# Patient Record
Sex: Female | Born: 1955 | Race: Black or African American | Hispanic: No | Marital: Married
Health system: Southern US, Community
[De-identification: ages and names within clinical notes are randomized; demographics above are authoritative.]

---

## 2008-08-24 ENCOUNTER — Encounter: Admission: RE | Admit: 2008-08-24 | Discharge: 2008-08-24 | Payer: Self-pay | Admitting: Family Medicine

## 2010-01-19 ENCOUNTER — Ambulatory Visit: Payer: Self-pay | Admitting: Vascular Surgery

## 2010-02-01 ENCOUNTER — Encounter: Admission: RE | Admit: 2010-02-01 | Discharge: 2010-02-01 | Payer: Self-pay | Admitting: Family Medicine

## 2011-01-17 ENCOUNTER — Ambulatory Visit
Admission: RE | Admit: 2011-01-17 | Discharge: 2011-01-17 | Disposition: A | Discharge status: Home / Self Care | Payer: BC Managed Care – HMO | Source: Ambulatory Visit | Attending: Family Medicine | Admitting: Family Medicine

## 2011-01-17 ENCOUNTER — Other Ambulatory Visit: Payer: Self-pay | Admitting: Family Medicine

## 2011-01-17 DIAGNOSIS — M25569 Pain in unspecified knee: Secondary | ICD-10-CM

## 2011-01-31 NOTE — Procedures (Signed)
LOWER EXTREMITY VENOUS REFLUX EXAM   INDICATION:  Painful left legs with spider veins.   EXAM:  Using color-flow imaging and pulse Doppler spectral analysis, the  left common femoral, superficial femoral, popliteal, posterior tibial,  greater and lesser saphenous veins are evaluated.  There is no evidence  suggesting deep venous insufficiency in the left lower extremity.   The left saphenofemoral junction is competent. The left GSV is  competent.   The left proximal short saphenous vein demonstrates competency.   GSV Diameter (used if found to be incompetent only)                                            Right    Left  Proximal Greater Saphenous Vein           cm       cm  Proximal-to-mid-thigh                     cm       cm  Mid thigh                                 cm       cm  Mid-distal thigh                          cm       cm  Distal thigh                              cm       cm  Knee                                      cm       cm   IMPRESSION:  1. No reflux noted in the left greater saphenous vein.  2. The left greater saphenous vein is not aneurysmal.  3. The left greater saphenous vein is not tortuous.  4. The deep venous system is competent.  5. The lesser saphenous vein is competent.   ___________________________________________  Larina Earthly, M.D.   CB/MEDQ  D:  01/19/2010  T:  01/19/2010  Job:  [id number]

## 2011-01-31 NOTE — Consult Note (Signed)
NEW PATIENT CONSULTATION   [name] [name]  DOB:  1955-11-21                                       01/19/2010  [id number]#:[id number]   The patient presents today for evaluation of bilateral lower extremity  venous pathology.  She is an active, healthy, 55 year old female who  presents for evaluation of painful telangiectasia over both lower  extremities.  She reports this is much more severe on the left leg than  on the right.  She does not have any history of DVT, no history of  __________  from these.  She does report that she has discomfort most  particularly over her left popliteal space and also over the left calf  and also medial thighs bilaterally with prolonged standing.  She has no  varicosities.   PAST MEDICAL HISTORY:  Negative for severe premature atherosclerotic  disease.  She denies diabetes or cardiac difficulty.   FAMILY HISTORY:  Negative for premature atherosclerotic disease.   SOCIAL HISTORY:  She is married with three children.  She works as a  IT sales professional.  She does not drink alcohol and does not smoke  cigarettes.   REVIEW OF SYSTEMS:  Positive for weight gain.  Otherwise completely  negative as documented in her chart.   PHYSICAL EXAMINATION:  Well developed, black female appearing stated  age.  Blood pressure 115/74, pulse 70, respirations 16.  She is in no  acute distress.  HEENT:  Negative.  Musculoskeletal:  She has no major  deformity or cyanosis.  Neurologic:  No focal weakness or paresthesias.  Skin:  Without ulcers or rashes.  She does have palpable pedal pulses  and palpable radial pulses bilaterally.   She underwent noninvasive vascular laboratory studies in our office.  This revealed no evidence of left leg DVT.  No evidence of deep venous  reflux.  Her small and great saphenous veins showed no reflux in her  left leg as well.  She does have significant telangiectasias diffusely  over both lower extremities.  These are  more prominent in her left  popliteal space and large plexus of these in her left lateral calf.   I discussed her noninvasive studies with the patient and explained that  I do not see any evidence of serious venous pathology.  I did explain  treatment option for the telangiectasia which would be sclerotherapy.  I  have explained that despite the discomfort related to these that  insurance carries do not pay for sclerotherapy.  I did explain our out-  of-pocket potential cost for treatment of these areas of discomfort.  I  also explained that the diffuse nature of her spider vein  telangiectasias could all be treated but this would require multiple  repeated treatment for correction of all these.  She will consider her  options and notify us should she wish to proceed with sclerotherapy     Larina Earthly, M.D.  Electronically Signed   TFE/MEDQ  D:  01/19/2010  T:  01/20/2010  Job:  [id number]   cc:   Renaye Rakers, M.D.

## 2012-05-14 ENCOUNTER — Other Ambulatory Visit (HOSPITAL_COMMUNITY)
Admission: RE | Admit: 2012-05-14 | Discharge: 2012-05-14 | Disposition: A | Discharge status: Home / Self Care | Payer: BC Managed Care – HMO | Source: Ambulatory Visit | Attending: Family Medicine | Admitting: Family Medicine

## 2012-05-14 DIAGNOSIS — Z01419 Encounter for gynecological examination (general) (routine) without abnormal findings: Secondary | ICD-10-CM | POA: Insufficient documentation

## 2012-05-14 DIAGNOSIS — Z1151 Encounter for screening for human papillomavirus (HPV): Secondary | ICD-10-CM | POA: Insufficient documentation

## 2013-11-19 ENCOUNTER — Other Ambulatory Visit: Payer: Self-pay | Admitting: Family Medicine

## 2013-11-19 DIAGNOSIS — M25561 Pain in right knee: Secondary | ICD-10-CM

## 2013-11-26 ENCOUNTER — Ambulatory Visit
Admission: RE | Admit: 2013-11-26 | Discharge: 2013-11-26 | Disposition: A | Discharge status: Home / Self Care | Payer: BC Managed Care – HMO | Source: Ambulatory Visit | Attending: Family Medicine | Admitting: Family Medicine

## 2013-11-26 DIAGNOSIS — M25561 Pain in right knee: Secondary | ICD-10-CM

## 2015-07-04 IMAGING — US US EXTREM LOW*R* LIMITED
1 series · 14 of 25 positions shown · non-contrast
Comparison: none

[Series 1: us extrem low*right* limited · 0.17mm/px · 14 of 43 slices shown]
[im 1/43]
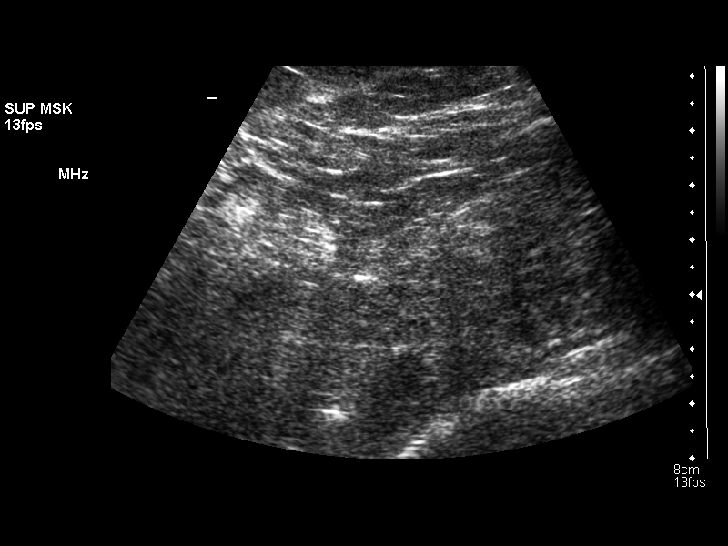
[im 4/43]
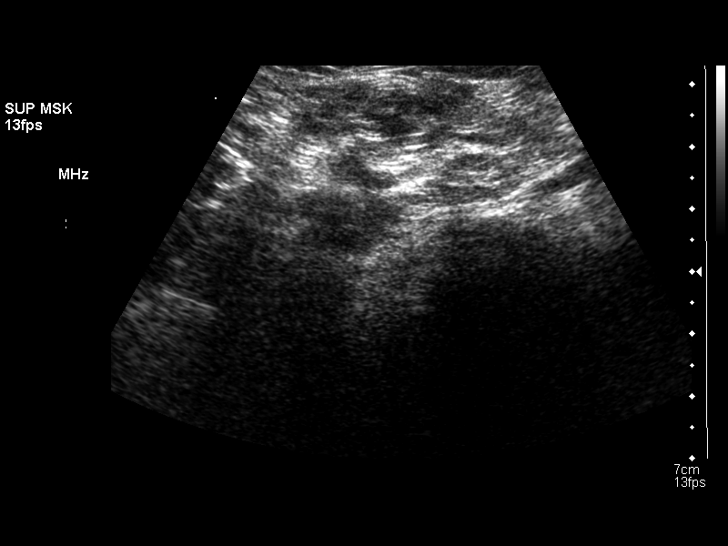
[im 8/43]
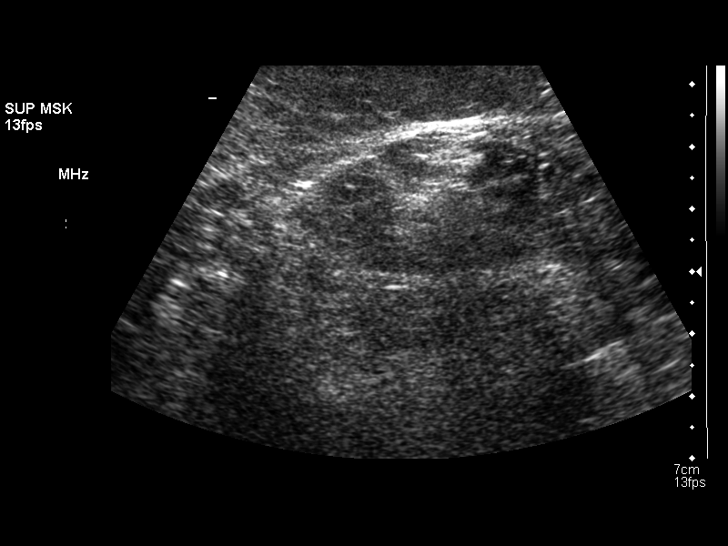
[im 11/43]
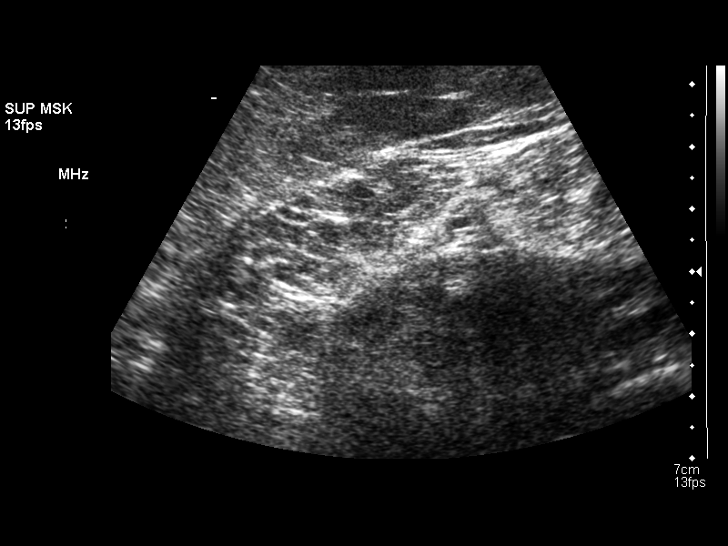
[im 15/43]
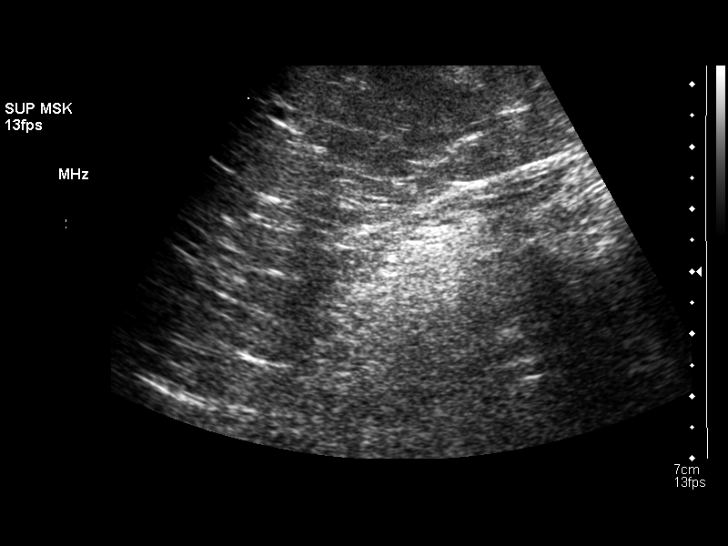
[im 16/43]
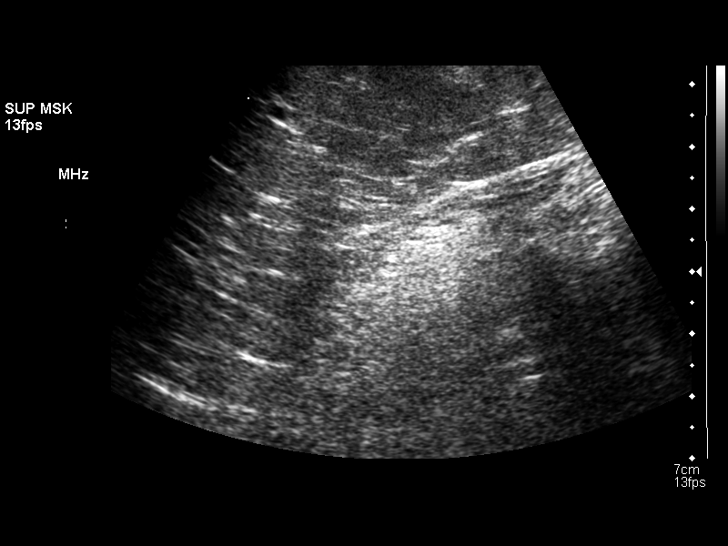
[im 20/43]
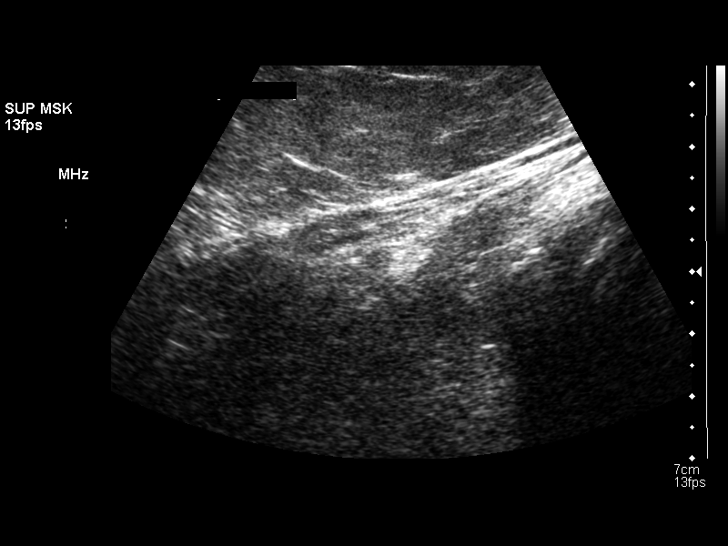
[im 23/43]
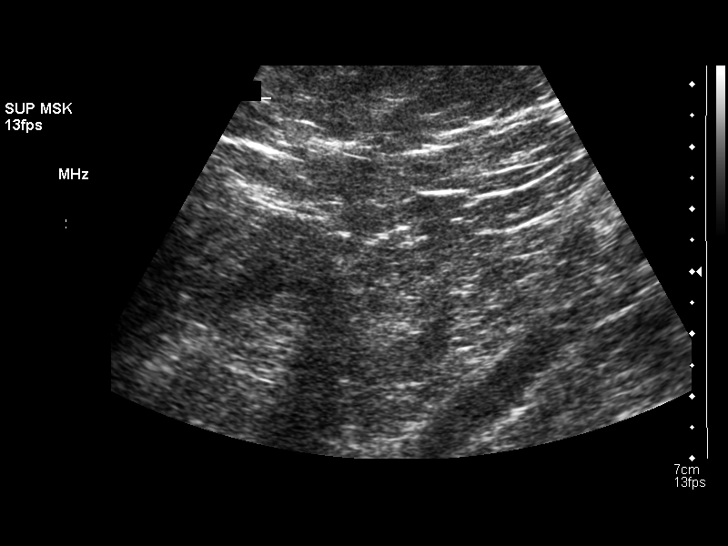
[im 27/43]
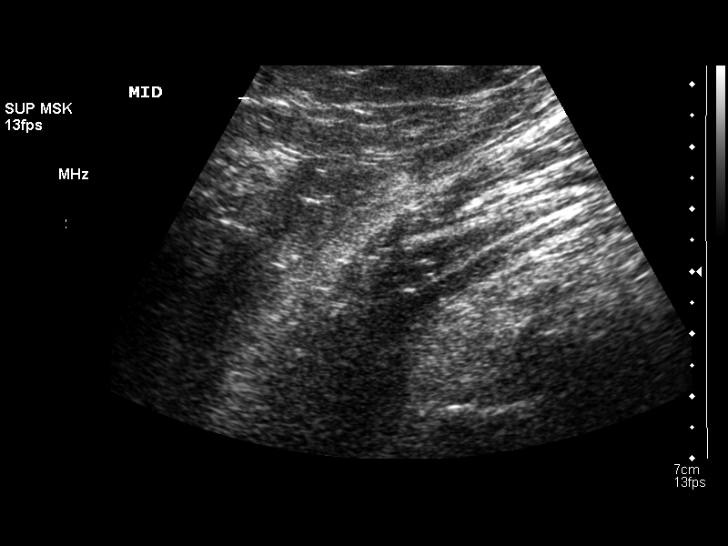
[im 29/43]
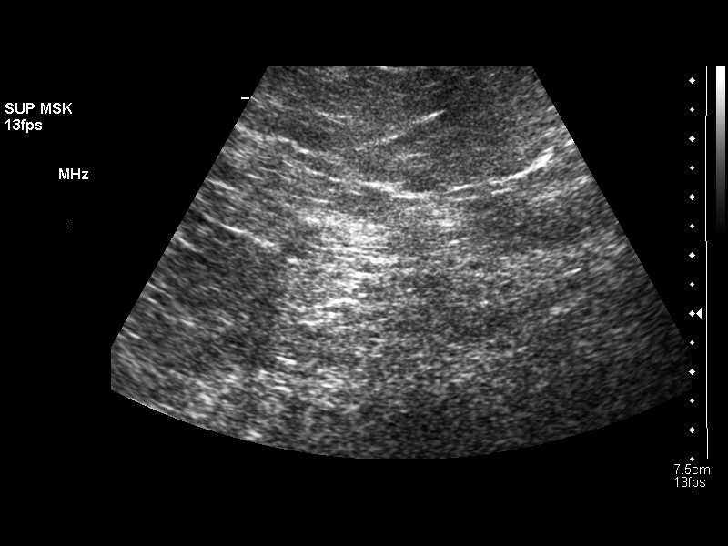
[im 32/43]
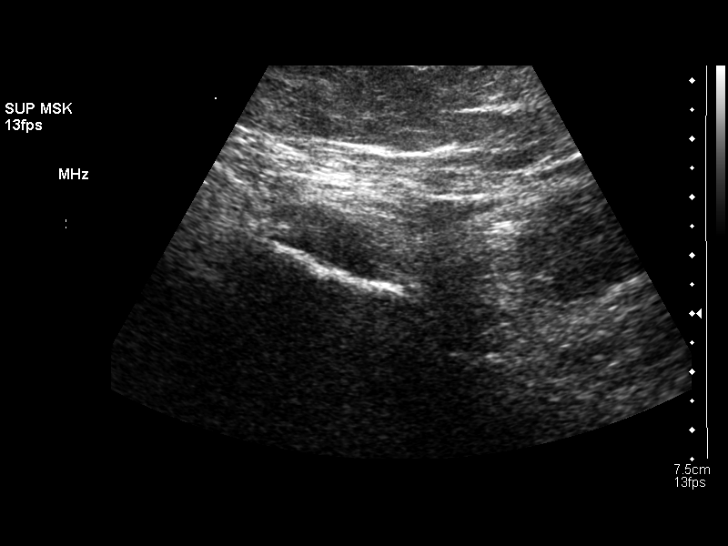
[im 36/43]
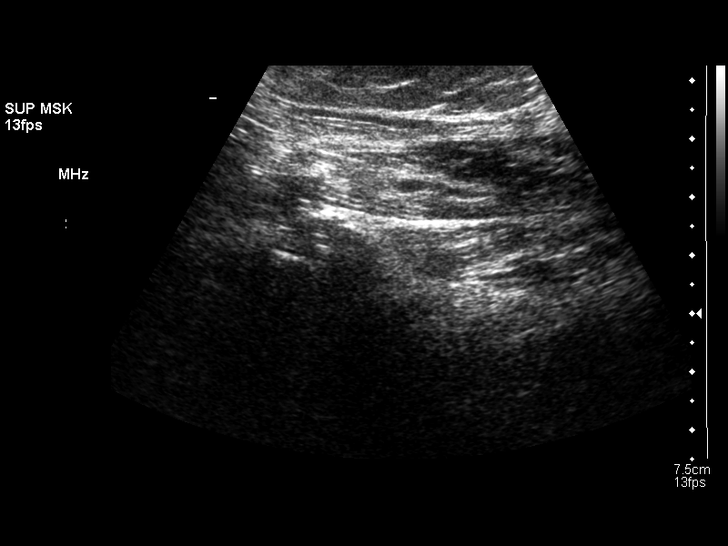
[im 39/43]
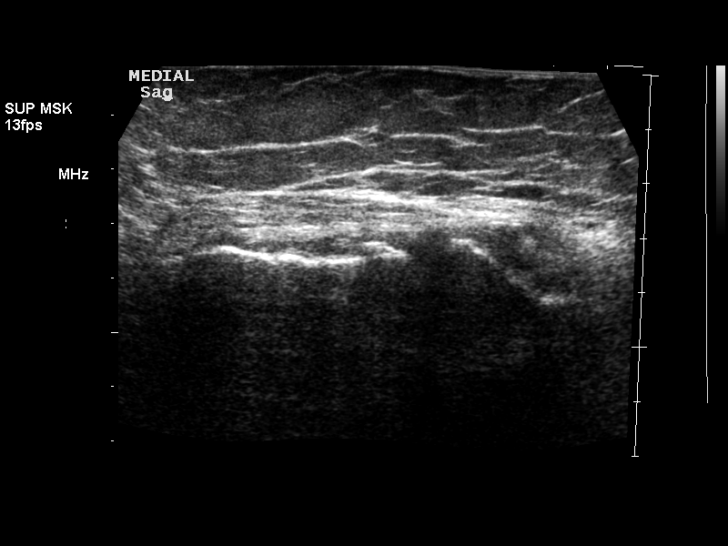
[im 43/43]
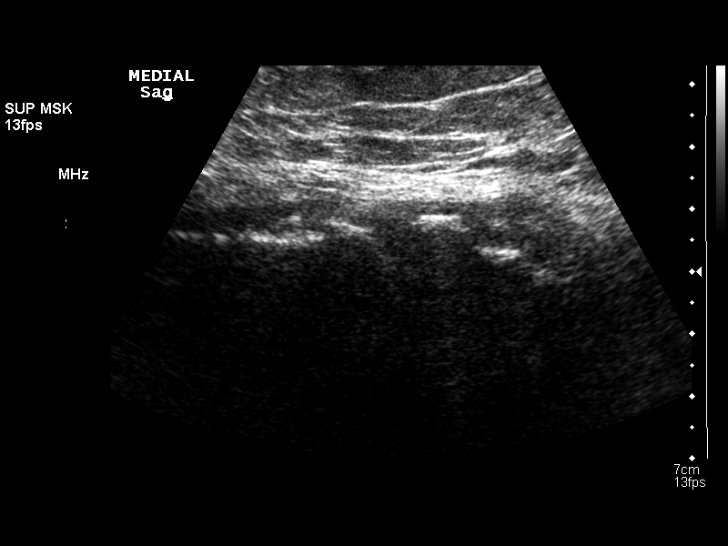

[14 of 25 positions shown; findings below may reference images not displayed]

CLINICAL DATA
Right leg pain.  Baker cyst.

EXAM
ULTRASOUND Right LOWER EXTREMITY LIMITED

TECHNIQUE
Ultrasound examination of the lower extremity soft tissues was
performed in the area of clinical concern.

COMPARISON
DG KNEE 2 VIEWS*R* dated 02/01/2010

FINDINGS
No Baker's cyst is identified. There is a thin area of fluid in the
proximal leg medially just inferior to the popliteal fossa.
Potentially this represents ruptured Baker's cyst with fluid
tracking along the gastrocnemius.

IMPRESSION
No Baker's cyst identified. Small amount of fluid in the proximal
leg may represent ruptured Baker's cyst.

SIGNATURE

## 2015-10-06 ENCOUNTER — Ambulatory Visit
Admission: RE | Admit: 2015-10-06 | Discharge: 2015-10-06 | Disposition: A | Discharge status: Home / Self Care | Payer: BLUE CROSS/BLUE SHIELD | Source: Ambulatory Visit | Attending: Family Medicine | Admitting: Family Medicine

## 2015-10-06 ENCOUNTER — Other Ambulatory Visit: Payer: Self-pay | Admitting: Family Medicine

## 2015-10-06 DIAGNOSIS — M79671 Pain in right foot: Secondary | ICD-10-CM

## 2017-05-13 IMAGING — CR DG FOOT COMPLETE 3+V*R*
3 series · 3 of 3 positions shown · non-contrast
Comparison: Right ankle series 35255.

CLINICAL DATA: 60-year-old female with right foot pain for 5
months, increasing. Lateral pain radiating to the Achilles area. No
known injury. Initial encounter.

EXAM:
RIGHT FOOT COMPLETE - 3+ VIEW

[x foot ap right]
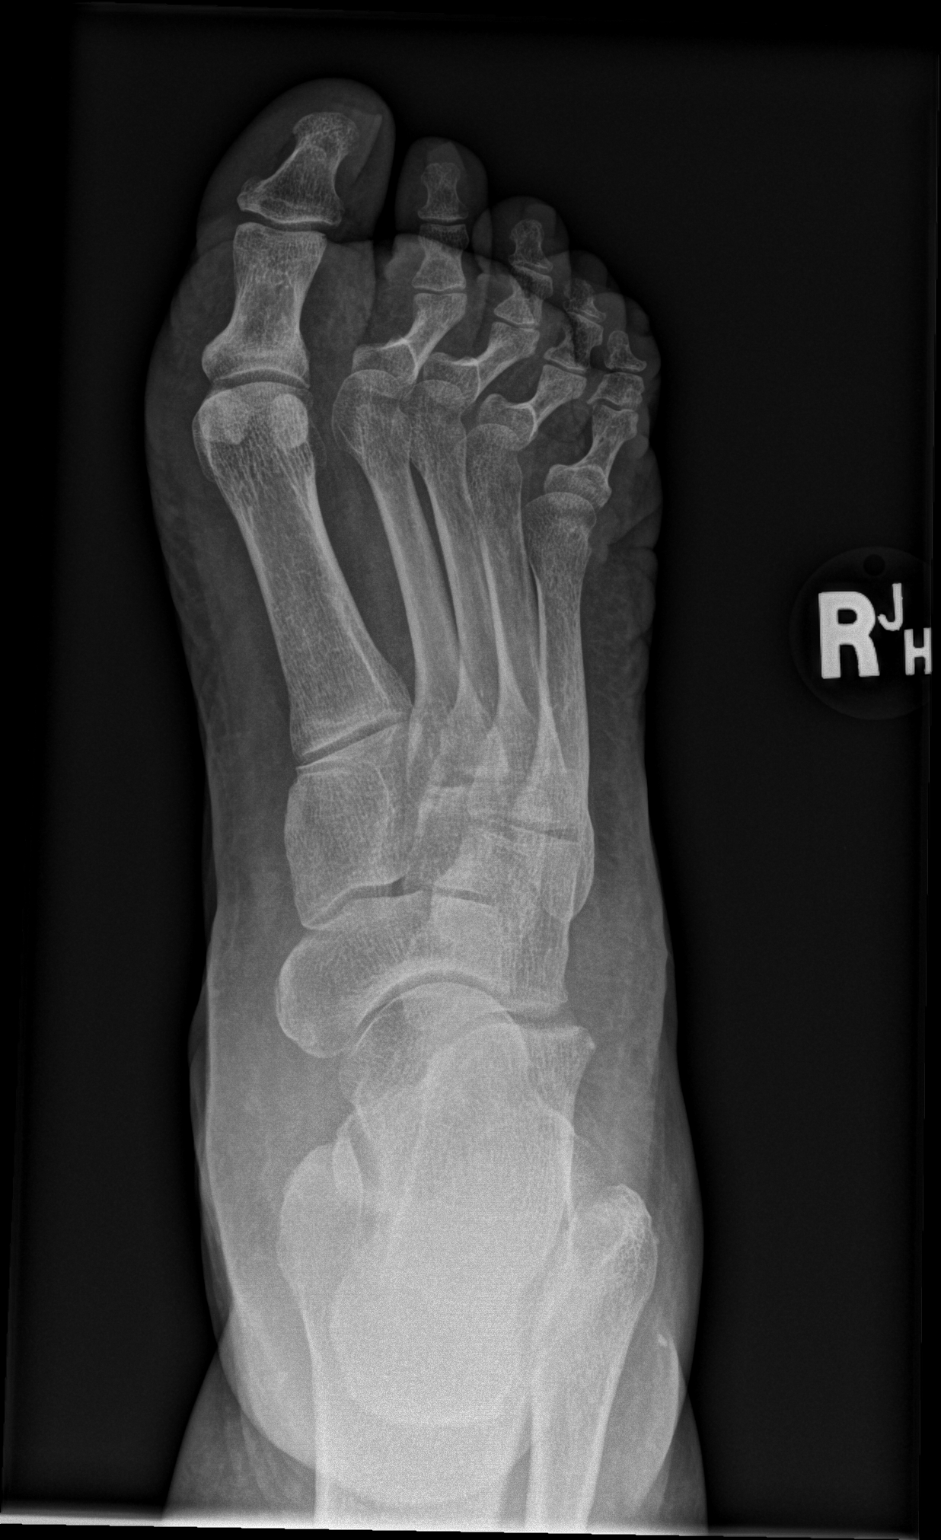

[x foot obl right]
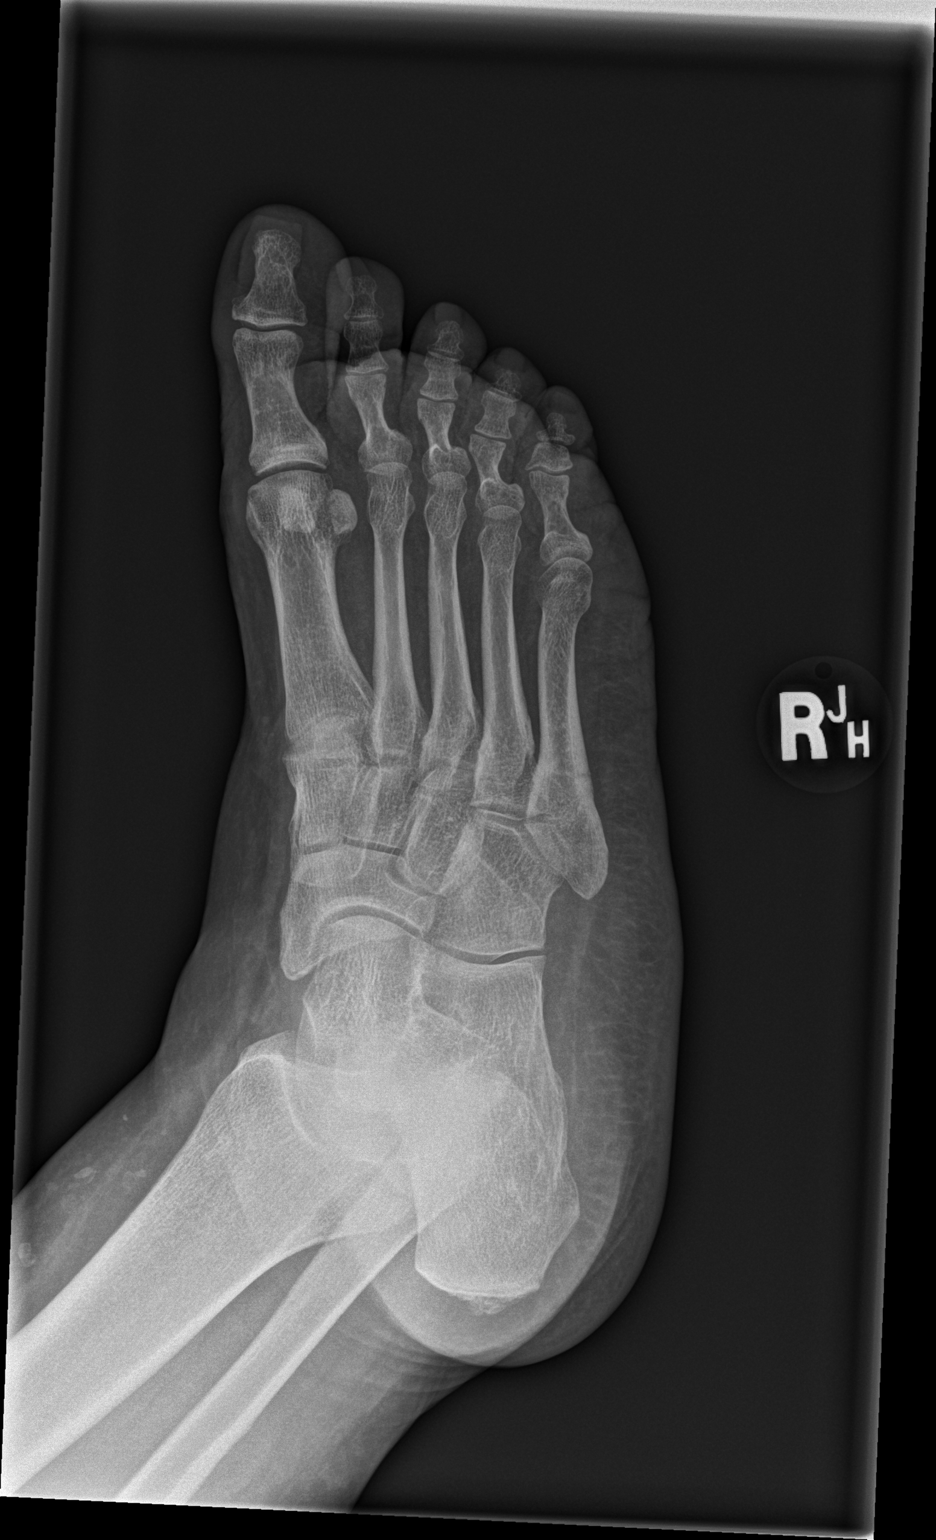

[x foot lat right]
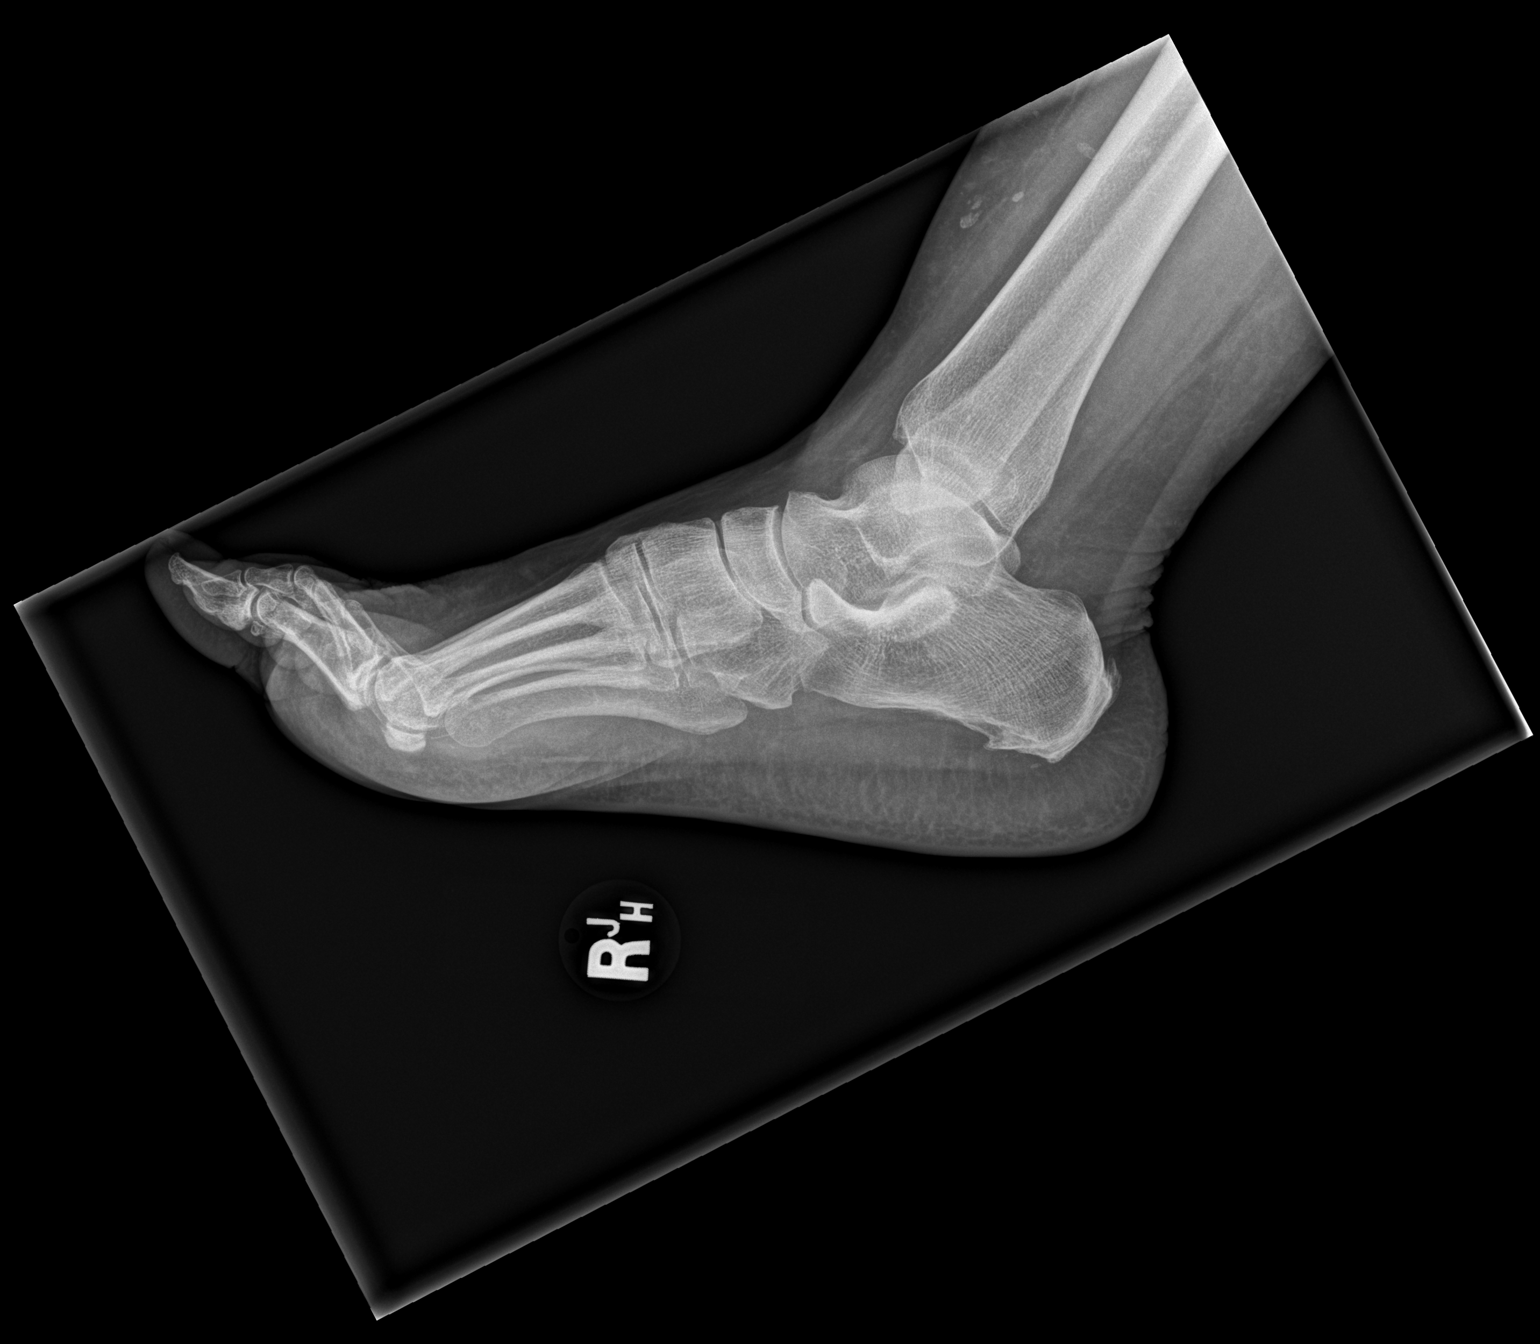

[3 of 3 positions shown; findings below may reference images not displayed]

FINDINGS: Lucency through the lateral cortex of the proximal fifth metatarsal
(arrow), new since 8977. Pes planus. Calcaneus appears intact with
mildly increased degenerative spurring. Other metatarsals appear
intact. Phalanges appear intact. Joint spaces within normal limits.
Tarsal bones appear intact.
IMPRESSION: Stress fracture (nondisplaced) at the proximal meta diaphysis of the
fifth metatarsal. No other acute osseous abnormality identified.

## 2018-10-29 ENCOUNTER — Encounter: Payer: Self-pay | Admitting: Nurse Practitioner

## 2023-03-21 ENCOUNTER — Ambulatory Visit
Admission: RE | Admit: 2023-03-21 | Discharge: 2023-03-21 | Disposition: A | Discharge status: Home / Self Care | Payer: BLUE CROSS/BLUE SHIELD | Source: Ambulatory Visit | Attending: Family Medicine | Admitting: Family Medicine

## 2023-03-21 ENCOUNTER — Other Ambulatory Visit: Payer: Self-pay | Admitting: Family Medicine

## 2023-03-21 DIAGNOSIS — M25532 Pain in left wrist: Secondary | ICD-10-CM

## 2024-09-23 NOTE — Progress Notes (Unsigned)
 "               [name] Brown [name] [address] [place] [address] Phone: [phone]   Assessment and Plan:     1. Chronic pain of left knee (Primary) 2. Primary osteoarthritis of left knee -Chronic with exacerbation, initial sports medicine visit - Consistent with flare of osteoarthritis, primarily medial and patellofemoral compartments. - Reviewed x-ray at today's visit.  My interpretation: No acute fracture or dislocation.  Primarily medial and patellofemoral compartment osteoarthritis - From reviewing orthopedic surgery notes, it appears patient had an MRI that showed insufficiency fracture, osteoarthritis, meniscal degeneration.  Will have patient sign records release form to obtain this imaging and results. - Patient says she had CSI at visit in 06/2024 with no improvement.  Could consider Zilretta versus HA injection in the future.  Will obtain prior authorization - Start meloxicam  15 mg daily x2 weeks.  If still having pain after 2 weeks, complete 3rd-week of NSAID. May use remaining NSAID as needed once daily for pain control.  Do not to use additional over-the-counter NSAIDs (ibuprofen, naproxen, Advil, Aleve, etc.) while taking prescription NSAIDs.  May use Tylenol [phone] mg 2 to 3 times a day for breakthrough pain.    15 additional minutes spent for educating Therapeutic Home Exercise Program.  This included exercises focusing on stretching, strengthening, with focus on eccentric aspects.   Long term goals include an improvement in range of motion, strength, endurance as well as avoiding reinjury. Patient's frequency would include in 1-2 times a day, 3-5 times a week for a duration of 6-12 weeks. Proper technique shown and discussed handout in great detail with ATC.  All questions were discussed and answered.    Pertinent previous records reviewed include  ortho note 06/25/24   Follow Up: 4 weeks for reevaluation.  could consider zilretta vs  HA vs PT if no improvement     Subjective:   I, [name] Brown, am serving as a neurosurgeon for [name] Brown  Chief Complaint: left knee pain   HPI:   09/24/2024 Patient is a 69 year old female with left knee pain. Patient states pain since September. No MOI. Decreased ROM . Has had a CSI in the past. Pain when walking. Ibu and tylenol do not help.   Was seen [facility] 06/25/2024 I discussed the situation with the patient. She does have a decompensated medial compartment. She does have the advanced degenerative change and now the root tear has likely escalated her symptoms. She does have the insufficiency fracture which is also contributing. Fortunately, she seems to be trending some better. At this point in time organ to continue conservative management with gentle range of motion and home exercises. I discussed a recumbent bike as potentially an option for treatment for her. I did refill her hydrocodone which she is trying to use very sparingly. I have discussed this with one of my partners Dr. Teresa who stated she may end up needing a total knee arthroplasty. She is not to the point she would consider that as of yet. Will continue to manage this conservatively. Follow-up in 1 month for recheck. All questions were encouraged and answered.    Relevant Historical Information: None pertinent  Additional pertinent review of systems negative.  Current Medications[1]   Objective:     Vitals:   09/24/24 1102  Pulse: 62  SpO2: 97%  Weight: 260 lb (117.9 kg)  Height: 5' 6 (1.676 m)  Body mass index is 41.97 kg/m.    Physical Exam:    General:  awake, alert oriented, no acute distress nontoxic Skin: no suspicious lesions or rashes Neuro:sensation intact and strength 5/5 with no deficits, no atrophy, normal muscle tone Psych: No signs of anxiety, depression or other mood disorder  Right Knee: Moderate swelling No deformity Positive fluid wave, joint milking ROM Flex  110, Ext 0 TTP medial joint line, medial femoral condyle Positive patellar grind NTTP over the quad tendon,   lat fem condyle, patella, patella tendon, tibial tuberostiy, fibular head, posterior fossa, pes anserine bursa, gerdy's tubercle,   lateral jt line Neg anterior and posterior drawer Neg lachman Negative varus stress Negative valgus stress Negative McMurray for palpable pop, though reproduced pain Positive Thessaly  Gait antalgic, favoring left leg    Electronically signed by:  [name] Brown [name] 11:35 AM 09/24/2024     [1]  Current Outpatient Medications:    meloxicam  (MOBIC ) 15 MG tablet, Take 1 tablet daily for 2 weeks.  If still in pain after 2 weeks, take 1 tablet daily for an additional 1 week., Disp: 30 tablet, Rfl: 0  "

## 2024-09-24 ENCOUNTER — Telehealth: Payer: Self-pay | Admitting: Sports Medicine

## 2024-09-24 ENCOUNTER — Ambulatory Visit

## 2024-09-24 ENCOUNTER — Ambulatory Visit: Admitting: Sports Medicine

## 2024-09-24 VITALS — HR 62 | Ht 66.0 in | Wt 260.0 lb

## 2024-09-24 DIAGNOSIS — M1712 Unilateral primary osteoarthritis, left knee: Secondary | ICD-10-CM

## 2024-09-24 DIAGNOSIS — M25562 Pain in left knee: Secondary | ICD-10-CM

## 2024-09-24 DIAGNOSIS — G8929 Other chronic pain: Secondary | ICD-10-CM

## 2024-09-24 MED ORDER — MELOXICAM 15 MG PO TABS: ORAL_TABLET | ORAL | 0 refills | Status: AC

## 2024-09-24 NOTE — Telephone Encounter (Signed)
 Right knee HA and zilretta

## 2024-09-24 NOTE — Patient Instructions (Signed)
-   Start meloxicam 15 mg daily x2 weeks.  If still having pain after 2 weeks, complete 3rd-week of NSAID. May use remaining NSAID as needed once daily for pain control.  Do not to use additional over-the-counter NSAIDs (ibuprofen, naproxen, Advil, Aleve, etc.) while taking prescription NSAIDs.  May use Tylenol [phone] mg 2 to 3 times a day for breakthrough pain. Knee HEP  4 week follow up

## 2024-09-25 NOTE — Telephone Encounter (Signed)
 Durolane benefits ran for right knee case [id number]

## 2024-09-26 NOTE — Telephone Encounter (Signed)
 noted

## 2024-09-26 NOTE — Telephone Encounter (Signed)
 Scheduled 10/22/24  DUROLANE authorized for right knee NO PRE CERT REQUIRED Patient responsible for 20% coinsurance Copay $35 Deductible does not apply OOP MAX $4200 has met $0 Once OOP has been met coverage is 100% and copay will no longer apply Reference # [id number]

## 2024-10-02 ENCOUNTER — Ambulatory Visit: Payer: Self-pay | Admitting: Sports Medicine

## 2024-10-22 ENCOUNTER — Ambulatory Visit: Admitting: Family Medicine

## 2024-10-22 ENCOUNTER — Ambulatory Visit: Admitting: Sports Medicine

## 2024-10-22 VITALS — HR 82 | Ht 66.0 in | Wt 263.0 lb

## 2024-10-22 DIAGNOSIS — G8929 Other chronic pain: Secondary | ICD-10-CM

## 2024-10-22 DIAGNOSIS — M1712 Unilateral primary osteoarthritis, left knee: Secondary | ICD-10-CM | POA: Diagnosis not present

## 2024-10-22 MED ORDER — MELOXICAM 15 MG PO TABS: 15.0000 mg | ORAL_TABLET | Freq: Every day | ORAL | 0 refills | Status: AC | PRN

## 2024-10-22 MED ORDER — SODIUM HYALURONATE 60 MG/3ML IX PRSY
60.0000 mg | PREFILLED_SYRINGE | Freq: Once | INTRA_ARTICULAR | Status: AC
  Administered 2024-10-22: 60 mg via INTRA_ARTICULAR

## 2024-10-22 NOTE — Patient Instructions (Signed)
-   Use meloxicam  15 mg daily as needed for breakthrough pain.  Recommend limiting chronic NSAIDs to 1-2 doses per week to prevent long-term side effects. Use Tylenol 500 to 1000 mg tablets 2-3 times a day as needed for day-to-day pain relief.    Meloxicam  refill   6-8 week follow up

## 2024-12-03 ENCOUNTER — Ambulatory Visit: Admitting: Sports Medicine
